# Patient Record
Sex: Male | Born: 1985 | Race: Asian | Hispanic: No | Marital: Married | Smoking: Never smoker
Health system: Southern US, Community
[De-identification: ages and names within clinical notes are randomized; demographics above are authoritative.]

## PROBLEM LIST (undated history)

## (undated) HISTORY — PX: HAND SURGERY: SHX662

---

## 2019-10-02 ENCOUNTER — Emergency Department (INDEPENDENT_AMBULATORY_CARE_PROVIDER_SITE_OTHER): Payer: 59

## 2019-10-02 ENCOUNTER — Encounter: Payer: Self-pay | Admitting: Emergency Medicine

## 2019-10-02 ENCOUNTER — Other Ambulatory Visit: Payer: Self-pay

## 2019-10-02 ENCOUNTER — Emergency Department
Admission: EM | Admit: 2019-10-02 | Discharge: 2019-10-02 | Disposition: A | Discharge status: Home / Self Care | Payer: 59 | Source: Home / Self Care | Attending: Emergency Medicine | Admitting: Emergency Medicine

## 2019-10-02 DIAGNOSIS — M25521 Pain in right elbow: Secondary | ICD-10-CM

## 2019-10-02 DIAGNOSIS — S5001XA Contusion of right elbow, initial encounter: Secondary | ICD-10-CM

## 2019-10-02 NOTE — Discharge Instructions (Addendum)
Please return to clinic if you develop increasing pain or problems with your elbow. Your x-rays were normal.

## 2019-10-02 NOTE — ED Provider Notes (Signed)
[facility]    CSN: [id number] Arrival date & time: 10/02/19  0818      History   Chief Complaint Chief Complaint  Patient presents with  . Elbow Pain    HPI Samiel Peel is a 33 y.o. male.  Patient states that 3 weeks ago he fell and struck his right elbow.  Following that he had pain and discomfort along with bruising to that area.  This has since resolved but recently he was checking the area and felt a little nodular area and was concerned he had a chip off of the bone.  He is here today to have that evaluated. HPI  History reviewed. No pertinent past medical history.  There are no problems to display for this patient.   History reviewed. No pertinent surgical history.     Home Medications    Prior to Admission medications   Not on File    Family History History reviewed. No pertinent family history.  Social History Social History   Tobacco Use  . Smoking status: Never Smoker  . Smokeless tobacco: Never Used  Substance Use Topics  . Alcohol use: Not on file  . Drug use: Not on file     Allergies   Patient has no known allergies.   Review of Systems Review of Systems  Constitutional: Negative.   Respiratory: Negative.   Musculoskeletal: Positive for joint swelling.  Neurological: Negative.      Physical Exam Triage Vital Signs ED Triage Vitals  Enc Vitals Group     BP 10/02/19 0853 127/76     Pulse Rate 10/02/19 0853 (!) 59     Resp --      Temp 10/02/19 0853 98 F (36.7 C)     Temp Source 10/02/19 0853 Oral     SpO2 10/02/19 0853 98 %     Weight 10/02/19 0854 155 lb 12 oz (70.6 kg)     Height 10/02/19 0854 5' 7"  (1.702 m)     Head Circumference --      Peak Flow --      Pain Score 10/02/19 0854 0     Pain Loc --      Pain Edu? --      Excl. in [place]? --    No data found.  Updated Vital Signs BP 127/76 (BP Location: Left Arm)   Pulse (!) 59   Temp 98 F (36.7 C) (Oral)   Ht 5' 7"  (1.702 m)   Wt 70.6 kg   SpO2 98%    BMI 24.39 kg/m   Visual Acuity Right Eye Distance:   Left Eye Distance:   Bilateral Distance:    Right Eye Near:   Left Eye Near:    Bilateral Near:     Physical Exam Constitutional:      Appearance: Normal appearance.  Pulmonary:     Effort: Pulmonary effort is normal.  Musculoskeletal:     Comments: There is a 1 x 1 cm nodular area over the olecranon.  There is good range of motion of the elbow with normal inversion and eversion of the wrist.  Neurovascular is intact.  There is a 1 cm healed scar present.  Skin:    General: Skin is warm and dry.  Neurological:     Mental Status: He is alert.      UC Treatments / Results  Labs (all labs ordered are listed, but only abnormal results are displayed) Labs Reviewed - No data to display  EKG  Radiology DG Elbow Complete Right  Result Date: 10/02/2019 CLINICAL DATA:  33 year old male status post fall onto concrete landing on right elbow 3 weeks ago. Persistent pain. EXAM: RIGHT ELBOW - COMPLETE 3+ VIEW COMPARISON:  None. FINDINGS: Bone mineralization is within normal limits. There is no evidence of fracture, dislocation, or joint effusion. There is no evidence of arthropathy or other focal bone abnormality. No discrete soft tissue injury. IMPRESSION: Negative. Electronically Signed   By: Genevie Ann M.D.   On: 10/02/2019 09:25    Procedures Procedures (including critical care time)  Medications Ordered in UC Medications - No data to display  Initial Impression / Assessment and Plan / UC Course  I have reviewed the triage vital signs and the nursing notes. I suspect he suffered a tiny chip off of the olecranon.  We will check a film to see if we can see any calcification over the olecranon process.  X-ray is unremarkable.  No treatment is necessary. Pertinent labs & imaging results that were available during my care of the patient were reviewed by me and considered in my medical decision making (see chart for details).        Final Clinical Impressions(s) / UC Diagnoses   Final diagnoses:  Right elbow pain  Contusion of right elbow, initial encounter     Discharge Instructions     Please return to clinic if you develop increasing pain or problems with your elbow. Your x-rays were normal.    ED Prescriptions    None     PDMP not reviewed this encounter.   Darlyne Russian, MD 10/02/19 [phone]

## 2019-10-02 NOTE — ED Triage Notes (Signed)
Patient fell 3 weeks ago, now having right elbow pain, had some swelling 3 weeks ago after injury, then bruising, now it's uncomfortable.

## 2020-09-13 ENCOUNTER — Emergency Department (INDEPENDENT_AMBULATORY_CARE_PROVIDER_SITE_OTHER)
Admission: EM | Admit: 2020-09-13 | Discharge: 2020-09-13 | Disposition: A | Discharge status: Home / Self Care | Payer: Self-pay | Source: Home / Self Care | Attending: Family Medicine | Admitting: Family Medicine

## 2020-09-13 ENCOUNTER — Encounter: Payer: Self-pay | Admitting: *Deleted

## 2020-09-13 ENCOUNTER — Other Ambulatory Visit: Payer: Self-pay

## 2020-09-13 DIAGNOSIS — S161XXA Strain of muscle, fascia and tendon at neck level, initial encounter: Secondary | ICD-10-CM

## 2020-09-13 DIAGNOSIS — S39012A Strain of muscle, fascia and tendon of lower back, initial encounter: Secondary | ICD-10-CM
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
## 2020-09-13 NOTE — Discharge Instructions (Signed)
If needed, you may use over the counter ibuprofen up to 800mg  three times daily with food.
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
## 2020-12-30 IMAGING — DX DG ELBOW COMPLETE 3+V*R*
4 series · 4 of 4 positions shown · non-contrast
Comparison: None.

CLINICAL DATA: 33-year-old male status post fall onto concrete
landing on right elbow 3 weeks ago. Persistent pain.

EXAM:
RIGHT ELBOW - COMPLETE 3+ VIEW

[elbow ap]
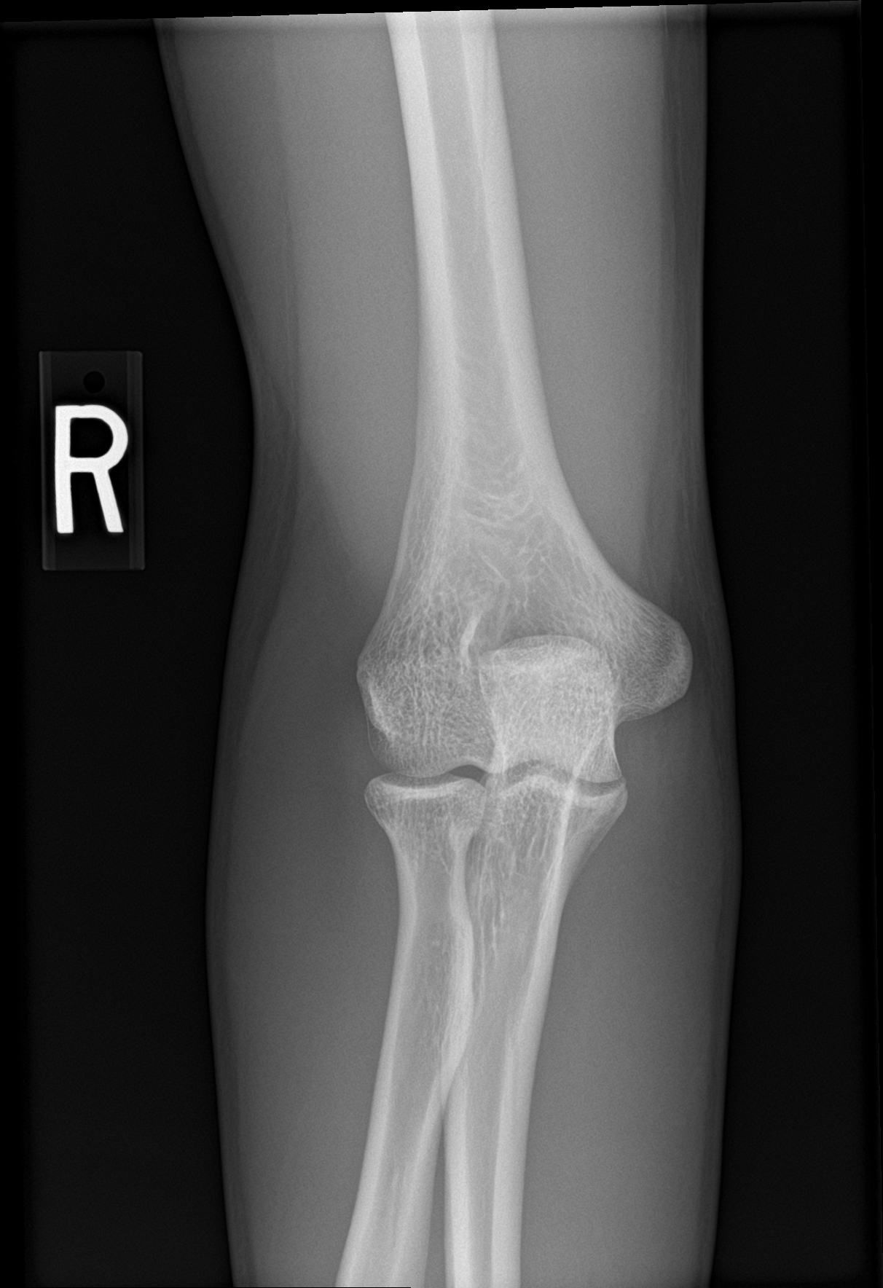

[elbow obl (1 of 2)]
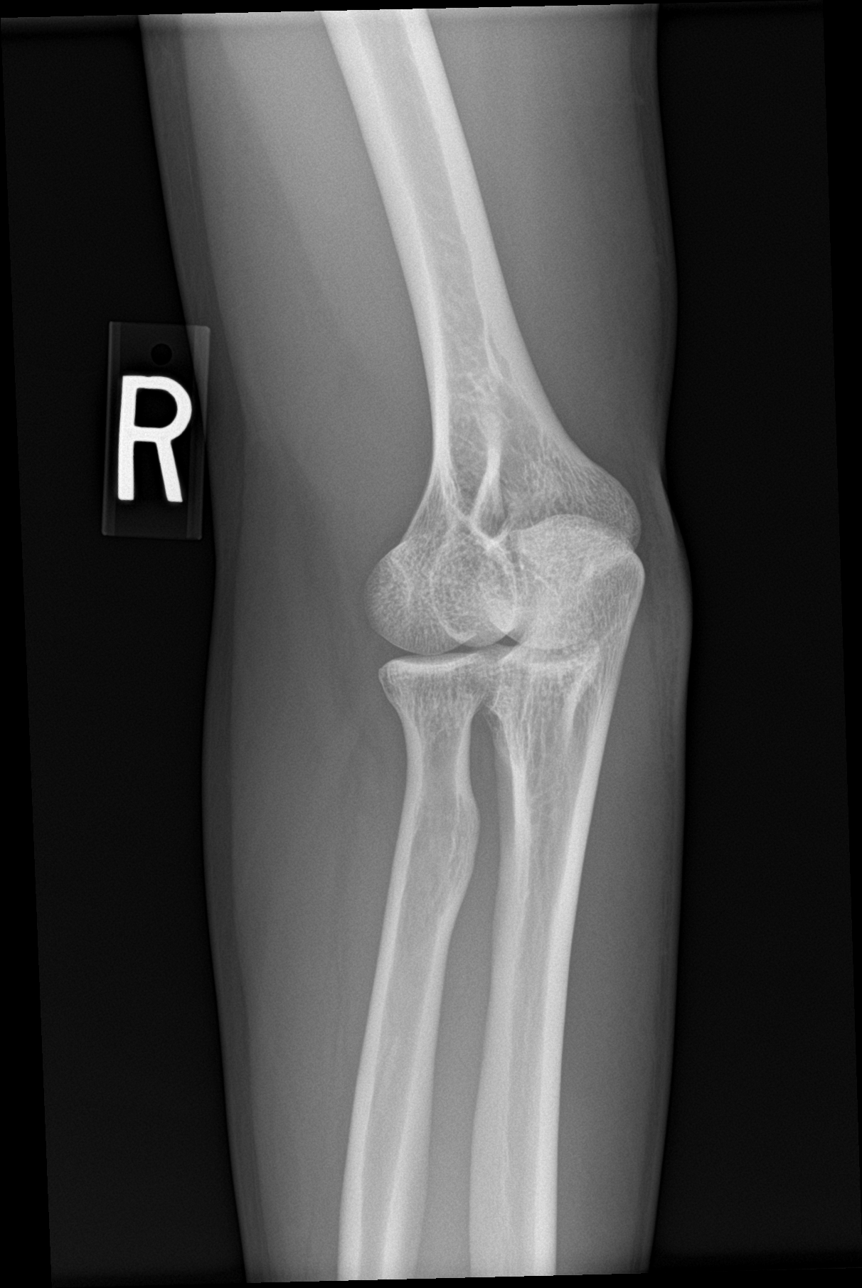

[elbow obl (2 of 2)]
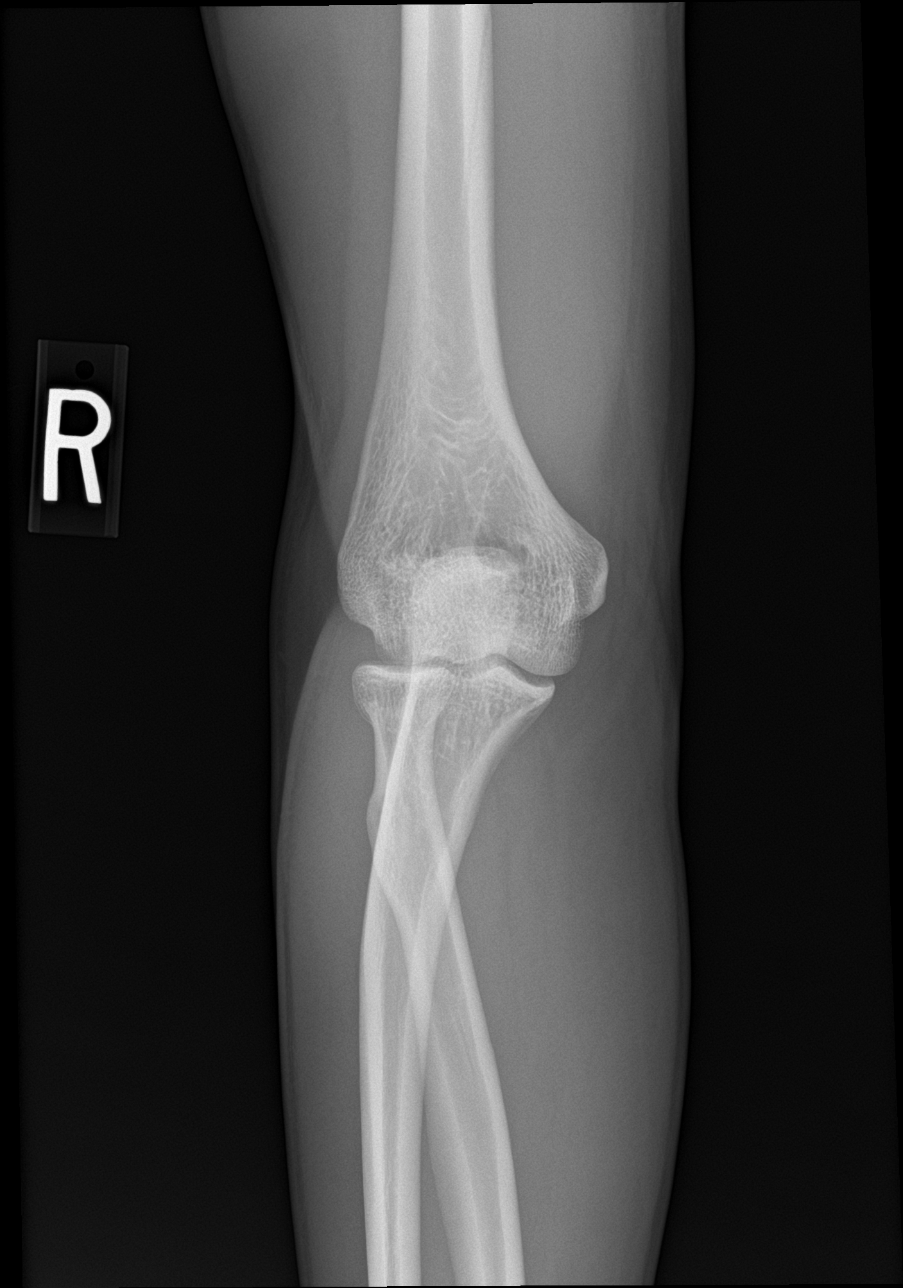

[elbow lat]
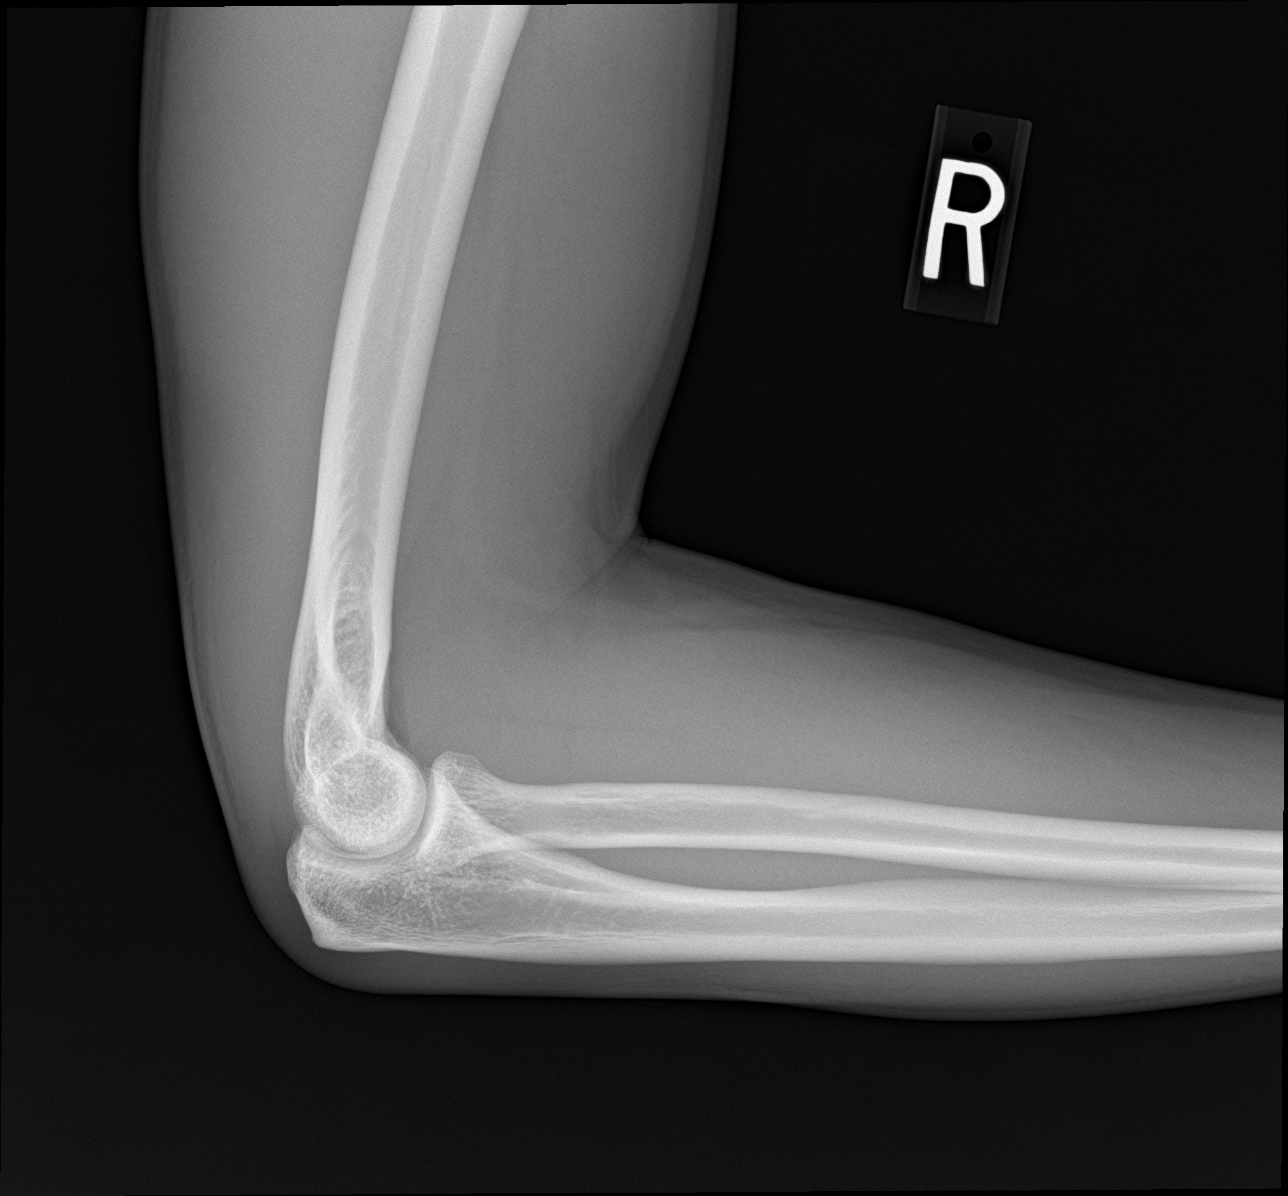

[4 of 4 positions shown; findings below may reference images not displayed]

FINDINGS: Bone mineralization is within normal limits. There is no evidence of
fracture, dislocation, or joint effusion. There is no evidence of
arthropathy or other focal bone abnormality. No discrete soft tissue
injury.
IMPRESSION: Negative.

## 2022-05-01 ENCOUNTER — Ambulatory Visit
Admission: EM | Admit: 2022-05-01 | Discharge: 2022-05-01 | Disposition: A | Discharge status: Home / Self Care | Payer: Managed Care, Other (non HMO) | Attending: Family Medicine | Admitting: Family Medicine

## 2022-05-01 ENCOUNTER — Encounter: Payer: Self-pay | Admitting: Emergency Medicine

## 2022-05-01 DIAGNOSIS — R590 Localized enlarged lymph nodes: Secondary | ICD-10-CM | POA: Insufficient documentation

## 2022-05-01 DIAGNOSIS — J039 Acute tonsillitis, unspecified: Secondary | ICD-10-CM | POA: Diagnosis present

## 2022-05-01 LAB — POCT RAPID STREP A (OFFICE): Rapid Strep A Screen: NEGATIVE

## 2022-05-01 MED ORDER — CEPHALEXIN 500 MG PO CAPS: 500.0000 mg | ORAL_CAPSULE | Freq: Two times a day (BID) | ORAL | 0 refills | Status: DC

## 2022-05-01 NOTE — ED Triage Notes (Signed)
Patient c/o sore throat, difficulty swallowing, swollen lymph node on the left side, fevers on and off x 2 days.  Patient states this happened about a month ago.  Taken Tylenol as needed.

## 2022-05-01 NOTE — Discharge Instructions (Signed)
Drink lots of fluids Take Tylenol or ibuprofen as needed for pain Take antibiotic 2 times a day Return if needed

## 2022-05-01 NOTE — ED Provider Notes (Signed)
[facility]    CSN: [id number] Arrival date & time: 05/01/22  6269      History   Chief Complaint Chief Complaint  Patient presents with   Sore Throat    HPI David Donovan is a 36 y.o. male.   HPI Patient states he has had fever off and on for 2 days with sore throat.  He also has a swollen gland on the left side of his neck.  His throat is more sore on the left than the right.  Pain with swallowing.  No runny or stuffy nose, cough.  No known exposure to illness. History reviewed. No pertinent past medical history.  There are no problems to display for this patient.   Past Surgical History:  Procedure Laterality Date   HAND SURGERY Right    fracture       Home Medications    Prior to Admission medications   Medication Sig Start Date End Date Taking? Authorizing Provider  cephALEXin (KEFLEX) 500 MG capsule Take 1 capsule (500 mg total) by mouth 2 (two) times daily. 05/01/22  Yes Raylene Everts, MD  Citric Acid POWD by Does not apply route.    [provider]  FOLIC ACID PO Take by mouth.    [provider]  Multiple Vitamins-Minerals (ZINC PO) Take by mouth.    [provider]  vitamin E 1000 UNIT capsule Take 1,000 Units by mouth daily.    [provider]    Family History Family History  Problem Relation Age of Onset   Healthy Mother    Healthy Father     Social History Social History   Tobacco Use   Smoking status: Never   Smokeless tobacco: Never  Vaping Use   Vaping Use: Never used  Substance Use Topics   Alcohol use: Yes    Comment: occ   Drug use: Never     Allergies   Patient has no known allergies.   Review of Systems Review of Systems See HPI  Physical Exam Triage Vital Signs ED Triage Vitals  Enc Vitals Group     BP 05/01/22 0857 123/76     Pulse Rate 05/01/22 0857 94     Resp 05/01/22 0857 18     Temp 05/01/22 0857 100.2 F (37.9 C)     Temp Source 05/01/22 0857 Oral      SpO2 05/01/22 0857 98 %     Weight 05/01/22 0858 150 lb (68 kg)     Height 05/01/22 0858 5' 7"  (1.702 m)     Head Circumference --      Peak Flow --      Pain Score 05/01/22 0857 4     Pain Loc --      Pain Edu? --      Excl. in [place]? --    No data found.  Updated Vital Signs BP 123/76 (BP Location: Left Arm)   Pulse 94   Temp 100.2 F (37.9 C) (Oral)   Resp 18   Ht 5' 7"  (1.702 m)   Wt 68 kg   SpO2 98%   BMI 23.49 kg/m      Physical Exam Constitutional:      General: He is not in acute distress.    Appearance: He is well-developed.  HENT:     Head: Normocephalic and atraumatic.     Right Ear: Tympanic membrane and ear canal normal.     Left Ear: Tympanic membrane and ear canal normal.  Nose: No congestion or rhinorrhea.     Mouth/Throat:     Mouth: Mucous membranes are moist.     Pharynx: Uvula midline. Posterior oropharyngeal erythema present.     Tonsils: No tonsillar exudate. 2+ on the right. 3+ on the left.  Eyes:     Conjunctiva/sclera: Conjunctivae normal.     Pupils: Pupils are equal, round, and reactive to light.  Neck:     Comments: 2 cm swelling tender node on left neck at angle of jaw Cardiovascular:     Rate and Rhythm: Normal rate.  Pulmonary:     Effort: Pulmonary effort is normal. No respiratory distress.  Abdominal:     General: There is no distension.     Palpations: Abdomen is soft.  Musculoskeletal:        General: Normal range of motion.     Cervical back: Normal range of motion.  Lymphadenopathy:     Cervical: Cervical adenopathy present.  Skin:    General: Skin is warm and dry.  Neurological:     Mental Status: He is alert.      UC Treatments / Results  Labs (all labs ordered are listed, but only abnormal results are displayed) Labs Reviewed  CULTURE, GROUP A STREP [facility])  POCT RAPID STREP A (OFFICE)    EKG   Radiology No results found.  Procedures Procedures (including critical care time)  Medications Ordered in  UC Medications - No data to display  Initial Impression / Assessment and Plan / UC Course  I have reviewed the triage vital signs and the nursing notes.  Pertinent labs & imaging results that were available during my care of the patient were reviewed by me and considered in my medical decision making (see chart for details).     Strep test is negative I have concern for an asymmetric tonsillitis with left greater than right, and left adenopathy on imaging.  We will provide antibiotic treatment.  Throat culture is pending Final Clinical Impressions(s) / UC Diagnoses   Final diagnoses:  Tonsillitis  Lymphadenopathy of left cervical region     Discharge Instructions      Drink lots of fluids Take Tylenol or ibuprofen as needed for pain Take antibiotic 2 times a day Return if needed   ED Prescriptions     Medication Sig Dispense Auth. Provider   cephALEXin (KEFLEX) 500 MG capsule Take 1 capsule (500 mg total) by mouth 2 (two) times daily. 14 capsule Raylene Everts, MD      PDMP not reviewed this encounter.   Raylene Everts, MD 05/01/22 [phone]

## 2022-05-03 LAB — CULTURE, GROUP A STREP (THRC)

## 2023-04-25 ENCOUNTER — Ambulatory Visit
Admission: EM | Admit: 2023-04-25 | Discharge: 2023-04-25 | Disposition: A | Discharge status: Home / Self Care | Payer: Managed Care, Other (non HMO) | Attending: Family Medicine | Admitting: Family Medicine

## 2023-04-25 DIAGNOSIS — J029 Acute pharyngitis, unspecified: Secondary | ICD-10-CM | POA: Diagnosis not present

## 2023-04-25 LAB — POCT RAPID STREP A (OFFICE): Rapid Strep A Screen: NEGATIVE

## 2023-04-25 MED ORDER — PREDNISONE 20 MG PO TABS: ORAL_TABLET | ORAL | 0 refills | Status: AC

## 2023-04-25 MED ORDER — AMOXICILLIN-POT CLAVULANATE 875-125 MG PO TABS: ORAL_TABLET | ORAL | 0 refills | Status: AC

## 2023-04-25 NOTE — ED Provider Notes (Signed)
[facility]    CSN: [id number] Arrival date & time: 04/25/23  1011      History   Chief Complaint Chief Complaint  Patient presents with   Sore Throat    HPI David Donovan is a 37 y.o. male.   Patient reports that he removed a large left tonsil stone about two weeks ago.  About 8 days ago he had a low grade fever that lasted two days followed by increasing sore throat that has persisted.  Patient reports that his children have had a viral URI, but he denies URI symptoms.  Recently he has coughed up small amounts of blood originating in his throat.  The history is provided by the patient.    History reviewed. No pertinent past medical history.  There are no problems to display for this patient.   Past Surgical History:  Procedure Laterality Date   HAND SURGERY Right    fracture       Home Medications    Prior to Admission medications   Medication Sig Start Date End Date Taking? Authorizing Provider  amoxicillin-clavulanate (AUGMENTIN) 875-125 MG tablet Take one tab PO Q12hr with food 04/25/23  Yes Konnie Noffsinger, Tera Mater, MD  predniSONE (DELTASONE) 20 MG tablet Take one tab by mouth twice daily for 3 days, then one daily.. Take with food. 04/25/23  Yes Lattie Haw, MD  Citric Acid POWD by Does not apply route.    [provider]  FOLIC ACID PO Take by mouth.    [provider]  Multiple Vitamins-Minerals (ZINC PO) Take by mouth.    [provider]  vitamin E 1000 UNIT capsule Take 1,000 Units by mouth daily.    [provider]    Family History Family History  Problem Relation Age of Onset   Healthy Mother    Healthy Father     Social History Social History   Tobacco Use   Smoking status: Never   Smokeless tobacco: Never  Vaping Use   Vaping status: Never Used  Substance Use Topics   Alcohol use: Yes    Comment: occ   Drug use: Never     Allergies   Patient has no known allergies.   Review of  Systems Review of Systems + sore throat No cough No pleuritic pain No wheezing No nasal congestion ? post-nasal drainage No sinus pain/pressure No itchy/red eyes No earache + hemoptysis No SOB + fever, resolved No nausea No vomiting No abdominal pain No diarrhea No urinary symptoms No skin rash No fatigue No myalgias No headache   Physical Exam Triage Vital Signs ED Triage Vitals [04/25/23 1118]  Encounter Vitals Group     BP 116/74     Systolic BP Percentile      Diastolic BP Percentile      Pulse Rate (!) 54     Resp 16     Temp 97.8 F (36.6 C)     Temp src      SpO2 98 %     Weight      Height      Head Circumference      Peak Flow      Pain Score 0     Pain Loc      Pain Education      Exclude from Growth Chart    No data found.  Updated Vital Signs BP 116/74   Pulse (!) 54   Temp 97.8 F (36.6 C)   Resp 16  SpO2 98%   Visual Acuity Right Eye Distance:   Left Eye Distance:   Bilateral Distance:    Right Eye Near:   Left Eye Near:    Bilateral Near:     Physical Exam Nursing notes and Vital Signs reviewed. Appearance:  Patient appears stated age, and in no acute distress Eyes:  Pupils are equal, round, and reactive to light and accomodation.  Extraocular movement is intact.  Conjunctivae are not inflamed  Ears:  Canals normal.  Tympanic membranes normal.  Nose:  Normal turbinates.  No sinus tenderness.   Pharynx:   Mildly erythematous diffusely. Neck:  Supple. No adenopathy.  Lungs:  Clear to auscultation.  Breath sounds are equal.  Moving air well. Heart:  Regular rate and rhythm without murmurs, rubs, or gallops.  Rate 54. Abdomen:  Nontender without masses or hepatosplenomegaly.  Bowel sounds are present.  No CVA or flank tenderness.  Extremities:  No edema.  Skin:  No rash present.   UC Treatments / Results  Labs (all labs ordered are listed, but only abnormal results are displayed) Labs Reviewed  POCT RAPID STREP A (OFFICE)  negative    EKG   Radiology No results found.  Procedures Procedures (including critical care time)  Medications Ordered in UC Medications - No data to display  Initial Impression / Assessment and Plan / UC Course  I have reviewed the triage vital signs and the nursing notes.  Pertinent labs & imaging results that were available during my care of the patient were reviewed by me and considered in my medical decision making (see chart for details).    Because of his recent tonsil stone and persistent pharyngitis, will begin brief prednisone burst/taper and Augmentin 875 BID for 5 days. Recommend follow-up with ENT if symptoms persist.  Final Clinical Impressions(s) / UC Diagnoses   Final diagnoses:  Pharyngitis, unspecified etiology     Discharge Instructions      Try warm salt water gargles several times daily for sore throat.     ED Prescriptions     Medication Sig Dispense Auth. Provider   predniSONE (DELTASONE) 20 MG tablet Take one tab by mouth twice daily for 3 days, then one daily.. Take with food. 9 tablet Lattie Haw, MD   amoxicillin-clavulanate (AUGMENTIN) 875-125 MG tablet Take one tab PO Q12hr with food 10 tablet Lattie Haw, MD         Lattie Haw, MD 04/27/23 1257

## 2023-04-25 NOTE — Discharge Instructions (Signed)
Try warm salt water gargles several times daily for sore throat.

## 2023-04-25 NOTE — ED Triage Notes (Signed)
Pt presents to uc with co of sore throat for 5 days. Pt reports children have recently had adenovirus. Pt reports he has been spiting out blood, has a history of tonsillitis. Dose not have ENT. Has taken Advil
